# Patient Record
Sex: Male | Born: 2000 | Race: Black or African American | Hispanic: No | Marital: Single | Smoking: Never smoker
Health system: Southern US, Community
[De-identification: ages and names within clinical notes are randomized; demographics above are authoritative.]

## PROBLEM LIST (undated history)

## (undated) DIAGNOSIS — J45909 Unspecified asthma, uncomplicated: Secondary | ICD-10-CM

## (undated) DIAGNOSIS — F909 Attention-deficit hyperactivity disorder, unspecified type: Secondary | ICD-10-CM

## (undated) HISTORY — DX: Attention-deficit hyperactivity disorder, unspecified type: F90.9

## (undated) HISTORY — DX: Unspecified asthma, uncomplicated: J45.909

---
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
## 2019-04-09 ENCOUNTER — Other Ambulatory Visit: Payer: Self-pay

## 2019-04-09 DIAGNOSIS — Z20822 Contact with and (suspected) exposure to covid-19: Secondary | ICD-10-CM

## 2019-04-11 LAB — NOVEL CORONAVIRUS, NAA: SARS-CoV-2, NAA: DETECTED — AB

## 2021-08-25 ENCOUNTER — Emergency Department (HOSPITAL_COMMUNITY)
Admission: EM | Admit: 2021-08-25 | Discharge: 2021-08-25 | Disposition: A | Discharge status: Home / Self Care | Payer: Managed Care, Other (non HMO) | Attending: Emergency Medicine | Admitting: Emergency Medicine

## 2021-08-25 ENCOUNTER — Encounter (HOSPITAL_COMMUNITY): Payer: Self-pay | Admitting: Emergency Medicine

## 2021-08-25 ENCOUNTER — Other Ambulatory Visit: Payer: Self-pay

## 2021-08-25 DIAGNOSIS — R112 Nausea with vomiting, unspecified: Secondary | ICD-10-CM | POA: Diagnosis present

## 2021-08-25 DIAGNOSIS — R1011 Right upper quadrant pain: Secondary | ICD-10-CM | POA: Insufficient documentation

## 2021-08-25 DIAGNOSIS — Z20822 Contact with and (suspected) exposure to covid-19: Secondary | ICD-10-CM | POA: Insufficient documentation

## 2021-08-25 DIAGNOSIS — E860 Dehydration: Secondary | ICD-10-CM | POA: Diagnosis not present

## 2021-08-25 LAB — URINALYSIS, ROUTINE W REFLEX MICROSCOPIC
Bacteria, UA: NONE SEEN
Bilirubin Urine: NEGATIVE
Glucose, UA: NEGATIVE mg/dL
Hgb urine dipstick: NEGATIVE
Ketones, ur: 5 mg/dL — AB
Leukocytes,Ua: NEGATIVE
Nitrite: NEGATIVE
Protein, ur: 30 mg/dL — AB
Specific Gravity, Urine: 1.032 — ABNORMAL HIGH (ref 1.005–1.030)
pH: 7 (ref 5.0–8.0)

## 2021-08-25 LAB — COMPREHENSIVE METABOLIC PANEL
ALT: 24 U/L (ref 0–44)
AST: 23 U/L (ref 15–41)
Albumin: 4.3 g/dL (ref 3.5–5.0)
Alkaline Phosphatase: 77 U/L (ref 38–126)
Anion gap: 10 (ref 5–15)
BUN: 20 mg/dL (ref 6–20)
CO2: 25 mmol/L (ref 22–32)
Calcium: 9.5 mg/dL (ref 8.9–10.3)
Chloride: 104 mmol/L (ref 98–111)
Creatinine, Ser: 1.37 mg/dL — ABNORMAL HIGH (ref 0.61–1.24)
GFR, Estimated: >60 mL/min (ref >60)
Glucose, Bld: 118 mg/dL — ABNORMAL HIGH (ref 70–99)
Potassium: 4.5 mmol/L (ref 3.5–5.1)
Sodium: 139 mmol/L (ref 135–145)
Total Bilirubin: 1.5 mg/dL — ABNORMAL HIGH (ref 0.3–1.2)
Total Protein: 7.2 g/dL (ref 6.5–8.1)

## 2021-08-25 LAB — CBC
HCT: 47.8 % (ref 39.0–52.0)
Hemoglobin: 15.6 g/dL (ref 13.0–17.0)
MCH: 26.4 pg (ref 26.0–34.0)
MCHC: 32.6 g/dL (ref 30.0–36.0)
MCV: 80.9 fL (ref 80.0–100.0)
Platelets: 260 10*3/uL (ref 150–400)
RBC: 5.91 MIL/uL — ABNORMAL HIGH (ref 4.22–5.81)
RDW: 12.7 % (ref 11.5–15.5)
WBC: 7.5 10*3/uL (ref 4.0–10.5)
nRBC: 0 % (ref 0.0–0.2)

## 2021-08-25 LAB — RESP PANEL BY RT-PCR (FLU A&B, COVID) ARPGX2
Influenza A by PCR: NEGATIVE
Influenza B by PCR: NEGATIVE
SARS Coronavirus 2 by RT PCR: NEGATIVE

## 2021-08-25 LAB — LIPASE, BLOOD: Lipase: 30 U/L (ref 11–51)

## 2021-08-25 MED ORDER — SODIUM CHLORIDE 0.9 % IV BOLUS
1000.0000 mL | Freq: Once | INTRAVENOUS | Status: AC
  Administered 2021-08-25: 1000 mL via INTRAVENOUS

## 2021-08-25 MED ORDER — ONDANSETRON HCL 4 MG PO TABS: 4.0000 mg | ORAL_TABLET | ORAL | 0 refills | Status: DC | PRN

## 2021-08-25 MED ORDER — FAMOTIDINE IN NACL 20-0.9 MG/50ML-% IV SOLN
20.0000 mg | Freq: Once | INTRAVENOUS | Status: AC
  Administered 2021-08-25: 20 mg via INTRAVENOUS
  Filled 2021-08-25: qty 50

## 2021-08-25 MED ORDER — KETOROLAC TROMETHAMINE 15 MG/ML IJ SOLN
15.0000 mg | Freq: Once | INTRAMUSCULAR | Status: AC
  Administered 2021-08-25: 15 mg via INTRAVENOUS
  Filled 2021-08-25: qty 1

## 2021-08-25 MED ORDER — DICYCLOMINE HCL 10 MG PO CAPS
10.0000 mg | ORAL_CAPSULE | Freq: Once | ORAL | Status: AC
  Administered 2021-08-25: 10 mg via ORAL
  Filled 2021-08-25: qty 1

## 2021-08-25 MED ORDER — SUCRALFATE 1 G PO TABS: 1.0000 g | ORAL_TABLET | Freq: Three times a day (TID) | ORAL | 0 refills | Status: AC

## 2021-08-25 MED ORDER — LIDOCAINE VISCOUS HCL 2 % MT SOLN
15.0000 mL | Freq: Once | OROMUCOSAL | Status: DC
  Filled 2021-08-25: qty 15

## 2021-08-25 MED ORDER — ALUM & MAG HYDROXIDE-SIMETH 200-200-20 MG/5ML PO SUSP
30.0000 mL | Freq: Once | ORAL | Status: DC
  Filled 2021-08-25: qty 30

## 2021-08-25 MED ORDER — ONDANSETRON HCL 4 MG/2ML IJ SOLN
4.0000 mg | Freq: Once | INTRAMUSCULAR | Status: AC
  Administered 2021-08-25: 4 mg via INTRAVENOUS
  Filled 2021-08-25: qty 2

## 2021-08-25 NOTE — ED Triage Notes (Signed)
Pt. Stated, I started having stomach pain yesterday and then was throwing up and nauseated. I threw up 3 - 4 times and just nauseated.  ?

## 2021-08-25 NOTE — ED Provider Notes (Signed)
?[facility] ?Provider Note ? ? ?CSN: [id number] ?Arrival date & time: 08/25/21  [id number] ? ?  ? ?History ? ?Chief Complaint  ?Patient presents with  ? Abdominal Pain  ? Emesis  ? Nausea  ? ? ?Eda PaschalDominique Defibaugh is a 21 y.o. male. ? ?This is a 21 y.o. male with significant medical history as below, including ADHD, asthma, prior marijuana who presents to the ED with complaint of abdominal pain, nausea and vomiting. ? ?Location: Epigastric, right upper quadrant ?Duration: Around 7 hours ?Onset: Sudden ?Timing: Intermittent ?Description: Aching, gnawing pain. ?Severity: Mild ?Exacerbating/Alleviating Factors: None reported ?Associated Symptoms: Nausea and vomiting, 1 episode of emesis around 2 AM.  No diarrhea.  No fevers or chills, no rashes.  No recent travel or sick contacts.  Last p.o. intake was yesterday evening with [employer].  No one else ate the same food. ? ?Context: Abdominal pain, nausea and vomiting since early this morning.  No further episodes of emesis.  No medications prior to arrival.  Denies recent alcohol or THC use.  No illicit drug use.  No diarrhea.  No further emesis.  Pain improving.  Initially was epigastric, migrated to her upper quadrant, right flank.  No urinary changes.  No history of kidney stone.  No fevers chills. ? ? ? ?Past Medical History: ?No date: ADHD (attention deficit hyperactivity disorder) ?No date: Asthma ? ?No past surgical history on file.  ? ? ?The history is provided by the patient and the spouse. No language interpreter was used.  ?Abdominal Pain ?Associated symptoms: nausea and vomiting   ?Associated symptoms: no chest pain, no chills, no cough, no fever, no hematuria and no shortness of breath   ?Emesis ?Associated symptoms: abdominal pain   ?Associated symptoms: no chills, no cough, no fever and no headaches   ? ?  ? ?Home Medications ?Prior to Admission medications   ?Medication Sig Start Date End Date Taking? Authorizing Provider   ?lisdexamfetamine (VYVANSE) 40 MG capsule Take 1 capsule (40 mg total) by mouth daily. ?Patient not taking: Reported on 08/25/2021 08/20/13   Donita BrooksPickard, Warren T, MD  ?   ? ?Allergies    ?Patient has no known allergies.   ? ?Review of Systems   ?Review of Systems  ?Constitutional:  Negative for chills and fever.  ?HENT:  Negative for facial swelling and trouble swallowing.   ?Eyes:  Negative for photophobia and visual disturbance.  ?Respiratory:  Negative for cough and shortness of breath.   ?Cardiovascular:  Negative for chest pain and palpitations.  ?Gastrointestinal:  Positive for abdominal pain, nausea and vomiting.  ?Endocrine: Negative for polydipsia and polyuria.  ?Genitourinary:  Negative for difficulty urinating and hematuria.  ?Musculoskeletal:  Negative for gait problem and joint swelling.  ?Skin:  Negative for pallor and rash.  ?Neurological:  Negative for syncope and headaches.  ?Psychiatric/Behavioral:  Negative for agitation and confusion.   ? ?Physical Exam ?Updated Vital Signs ?BP (!) 99/56   Pulse 84   Temp 98.4 ?F (36.9 ?C) (Oral)   Resp 20   SpO2 100%  ?Physical Exam ?Vitals and nursing note reviewed.  ?Constitutional:   ?   General: He is not in acute distress. ?   Appearance: Normal appearance. He is well-developed. He is not toxic-appearing or diaphoretic.  ?HENT:  ?   Head: Normocephalic and atraumatic.  ?   Right Ear: External ear normal.  ?   Left Ear: External ear normal.  ?   Mouth/Throat:  ?  Mouth: Mucous membranes are moist.  ?Eyes:  ?   General: No scleral icterus. ?Cardiovascular:  ?   Rate and Rhythm: Normal rate and regular rhythm.  ?   Pulses: Normal pulses.  ?   Heart sounds: Normal heart sounds.  ?Pulmonary:  ?   Effort: Pulmonary effort is normal. No respiratory distress.  ?   Breath sounds: Normal breath sounds.  ?Abdominal:  ?   General: Abdomen is flat.  ?   Palpations: Abdomen is soft.  ?   Tenderness: There is no abdominal tenderness. There is no guarding or rebound.   ?Musculoskeletal:     ?   General: Normal range of motion.  ?   Cervical back: Normal range of motion.  ?   Right lower leg: No edema.  ?   Left lower leg: No edema.  ?Skin: ?   General: Skin is warm and dry.  ?   Capillary Refill: Capillary refill takes less than 2 seconds.  ?Neurological:  ?   Mental Status: He is alert and oriented to person, place, and time.  ?Psychiatric:     ?   Mood and Affect: Mood normal.     ?   Behavior: Behavior normal.  ? ? ?ED Results / Procedures / Treatments   ?Labs ?(all labs ordered are listed, but only abnormal results are displayed) ?Labs Reviewed  ?COMPREHENSIVE METABOLIC PANEL - Abnormal; Notable for the following components:  ?    Result Value  ? Glucose, Bld 118 (*)   ? Creatinine, Ser 1.37 (*)   ? Total Bilirubin 1.5 (*)   ? All other components within normal limits  ?CBC - Abnormal; Notable for the following components:  ? RBC 5.91 (*)   ? All other components within normal limits  ?URINALYSIS, ROUTINE W REFLEX MICROSCOPIC - Abnormal; Notable for the following components:  ? Specific Gravity, Urine 1.032 (*)   ? Ketones, ur 5 (*)   ? Protein, ur 30 (*)   ? All other components within normal limits  ?RESP PANEL BY RT-PCR (FLU A&B, COVID) ARPGX2  ?LIPASE, BLOOD  ? ? ?EKG ?EKG Interpretation ? ?Date/Time:  Wednesday August 25 2021 09:49:27 EDT ?Ventricular Rate:  81 ?PR Interval:  151 ?QRS Duration: 80 ?QT Interval:  340 ?QTC Calculation: 395 ?R Axis:   99 ?Text Interpretation: Sinus rhythm similar to prior 4/16 no stemi Confirmed by Tanda Rockers ([id number]) on 08/25/2021 9:59:54 AM ? ?Radiology ?No results found. ? ?Procedures ?Marland KitchenCritical Care ?Performed by: Sloan Leiter, DO ?Authorized by: Sloan Leiter, DO  ? ?Critical care provider statement:  ?  Critical care time (minutes):  30 ?  Critical care time was exclusive of:  Separately billable procedures and treating other patients ?  Critical care was necessary to treat or prevent imminent or life-threatening deterioration of the  following conditions:  Dehydration ?  Critical care was time spent personally by me on the following activities:  Development of treatment plan with patient or surrogate, discussions with consultants, evaluation of patient's response to treatment, examination of patient, ordering and review of laboratory studies, ordering and review of radiographic studies, ordering and performing treatments and interventions, pulse oximetry, re-evaluation of patient's condition, review of old charts and obtaining history from patient or surrogate  ? ? ?Medications Ordered in ED ?Medications  ?alum & mag hydroxide-simeth (MAALOX/MYLANTA) 200-200-20 MG/5ML suspension 30 mL (30 mLs Oral Not Given 08/25/21 0950)  ?  And  ?lidocaine (XYLOCAINE) 2 % viscous mouth solution 15 mL (  15 mLs Oral Not Given 08/25/21 0950)  ?ondansetron [facility]) injection 4 mg (4 mg Intravenous Given 08/25/21 0939)  ?famotidine (PEPCID) IVPB 20 mg premix (0 mg Intravenous Stopped 08/25/21 1026)  ?dicyclomine (BENTYL) capsule 10 mg (10 mg Oral Given 08/25/21 0946)  ?ketorolac (TORADOL) 15 MG/ML injection 15 mg (15 mg Intravenous Given 08/25/21 0940)  ?sodium chloride 0.9 % bolus 1,000 mL (1,000 mLs Intravenous New Bag/Given 08/25/21 1054)  ?sodium chloride 0.9 % bolus 1,000 mL (0 mLs Intravenous Stopped 08/25/21 1053)  ? ? ?ED Course/ Medical Decision Making/ A&P ?  ?                        ?Medical Decision Making ?Amount and/or Complexity of Data Reviewed ?Labs: ordered. ? ?Risk ?OTC drugs. ?Prescription drug management. ? ? ?Initial Impression and Ddx ?This patient presents to the Emergency Department for the above complaint. This involves an extensive number of treatment options and is a complaint that carries with it a high risk of complications and morbidity. Vital signs were reviewed.  ? ?Serious etiologies considered. Ddx includes but is not limited to: Enteritis, gastroenteritis, viral, foodborne illness, MSK, biliary colic ? ?Patient PMH that increases  complexity of ED encounter: N/A ? ?Social determinants of health include -N/A ? ?Previous records obtained and reviewed  ? ?Additional history obtained from spouse ? ? ?Interpretation of Diagnostics ?Labs & imaging re

## 2021-08-25 NOTE — Discharge Instructions (Addendum)
It was a pleasure caring for you today in the emergency department.  Please return to the emergency department for any worsening or worrisome symptoms.    There are many causes of abdominal pain. Most pain is not serious and goes away, but some pain gets worse, changes, or will not go away. Please return to the emergency department or see your doctor right away if you (or your family member) experience any of the following:  1. Pain that gets worse or moves to just one spot.  2. Pain that gets worse if you cough or sneeze.  3. Pain with going over a bump in the road.  4. Pain that does not get better in 24 hours.  5. Inability to keep down liquids (vomiting)-especially if you are making less urine.  6. Fainting.  7. Blood in the vomit or stool.  8. High fever or shaking chills.  9. Swelling of the abdomen.  10. Any new or worsening problem.      Follow-up Instructions  See your primary care provider if not completely better in the next 2-3 days. Come to the ED if you are unable to see them in this time frame.    Additional Instructions  No alcohol.  No caffeine, aspirin, or cigarettes.   Please return to the emergency department immediately for any new or concerning symptoms, or if you get worse.  

## 2021-08-25 NOTE — ED Notes (Signed)
Po fluids given

## 2021-08-25 NOTE — ED Notes (Signed)
No vomiting

## 2021-11-04 ENCOUNTER — Other Ambulatory Visit: Payer: Self-pay

## 2021-11-04 ENCOUNTER — Emergency Department (HOSPITAL_COMMUNITY): Payer: Managed Care, Other (non HMO)

## 2021-11-04 ENCOUNTER — Encounter (HOSPITAL_COMMUNITY): Payer: Self-pay

## 2021-11-04 ENCOUNTER — Emergency Department (HOSPITAL_COMMUNITY)
Admission: EM | Admit: 2021-11-04 | Discharge: 2021-11-04 | Disposition: A | Discharge status: Home / Self Care | Payer: Managed Care, Other (non HMO) | Attending: Emergency Medicine | Admitting: Emergency Medicine

## 2021-11-04 DIAGNOSIS — S93402A Sprain of unspecified ligament of left ankle, initial encounter: Secondary | ICD-10-CM | POA: Diagnosis not present

## 2021-11-04 DIAGNOSIS — Y9367 Activity, basketball: Secondary | ICD-10-CM | POA: Diagnosis not present

## 2021-11-04 DIAGNOSIS — S99912A Unspecified injury of left ankle, initial encounter: Secondary | ICD-10-CM | POA: Diagnosis present

## 2021-11-04 DIAGNOSIS — J45909 Unspecified asthma, uncomplicated: Secondary | ICD-10-CM | POA: Diagnosis not present

## 2021-11-04 DIAGNOSIS — X501XXA Overexertion from prolonged static or awkward postures, initial encounter: Secondary | ICD-10-CM | POA: Diagnosis not present

## 2021-11-04 MED ORDER — IBUPROFEN 400 MG PO TABS
600.0000 mg | ORAL_TABLET | Freq: Once | ORAL | Status: AC
  Administered 2021-11-04: 600 mg via ORAL
  Filled 2021-11-04: qty 1

## 2021-11-04 MED ORDER — IBUPROFEN 600 MG PO TABS: 600.0000 mg | ORAL_TABLET | Freq: Three times a day (TID) | ORAL | 0 refills | Status: DC | PRN

## 2021-11-04 NOTE — Discharge Instructions (Addendum)
Take the medications as needed for pain.  Use the crutches and splint for support.  You can start putting weight on your ankle as tolerated.  Your symptoms should improve over the week.  Follow-up with an orthopedic doctor for further relation of the symptoms do not resolve

## 2021-11-04 NOTE — ED Provider Notes (Signed)
[facility] EMERGENCY DEPARTMENT Provider Note   CSN: [id number] Arrival date & time: 11/04/21  1944     History  Chief Complaint  Patient presents with   Ankle Pain    Clinton Dougherty is a 21 y.o. male.   Ankle Pain  Patient has history of ADHD and asthma.  Patient presents to the ED with complaints of an ankle injury.  Patient states he was playing basketball and twisted his left ankle.  Since that time he has not been able to bear weight and is having pain in his left ankle.  He denies any other injuries  Home Medications Prior to Admission medications   Medication Sig Start Date End Date Taking? Authorizing Provider  ibuprofen (ADVIL) 600 MG tablet Take 1 tablet (600 mg total) by mouth every 8 (eight) hours as needed. 11/04/21  Yes Linwood Dibbles, MD  lisdexamfetamine (VYVANSE) 40 MG capsule Take 1 capsule (40 mg total) by mouth daily. Patient not taking: Reported on 08/25/2021 08/20/13   Donita Brooks, MD  ondansetron (ZOFRAN) 4 MG tablet Take 1 tablet (4 mg total) by mouth every 4 (four) hours as needed for nausea or vomiting. 08/25/21   Tanda Rockers A, DO  sucralfate (CARAFATE) 1 g tablet Take 1 tablet (1 g total) by mouth 4 (four) times daily -  with meals and at bedtime for 7 days. 08/25/21 09/01/21  Sloan Leiter, DO      Allergies    Patient has no known allergies.    Review of Systems   Review of Systems  Physical Exam Updated Vital Signs BP 135/70 (BP Location: Right Arm)   Pulse 70   Temp 98.3 F (36.8 C) (Oral)   Resp 18   SpO2 97%  Physical Exam Vitals and nursing note reviewed.  Constitutional:      General: He is not in acute distress.    Appearance: He is well-developed.  HENT:     Head: Normocephalic and atraumatic.     Right Ear: External ear normal.     Left Ear: External ear normal.  Eyes:     General: No scleral icterus.       Right eye: No discharge.        Left eye: No discharge.     Conjunctiva/sclera: Conjunctivae  normal.  Neck:     Trachea: No tracheal deviation.  Cardiovascular:     Rate and Rhythm: Normal rate.  Pulmonary:     Effort: Pulmonary effort is normal. No respiratory distress.     Breath sounds: No stridor.  Abdominal:     General: There is no distension.  Musculoskeletal:        General: Swelling and tenderness present. No deformity.     Cervical back: Neck supple.     Left ankle: Swelling present. Tenderness present over the lateral malleolus. No base of 5th metatarsal or proximal fibula tenderness.     Left Achilles Tendon: Normal.  Skin:    General: Skin is warm and dry.     Findings: No rash.  Neurological:     Mental Status: He is alert.     Cranial Nerves: Cranial nerve deficit: no gross deficits.    ED Results / Procedures / Treatments   Labs (all labs ordered are listed, but only abnormal results are displayed) Labs Reviewed - No data to display  EKG None  Radiology DG Ankle Complete Left  Result Date: 11/04/2021 CLINICAL DATA:  Basketball injury. EXAM: LEFT ANKLE COMPLETE -  3+ VIEW COMPARISON:  None Available. FINDINGS: There is no evidence of fracture, dislocation, or joint effusion. There is no evidence of arthropathy or other focal bone abnormality. There is soft tissue swelling over the lateral malleolus. IMPRESSION: 1. No acute fracture or dislocation. 2. Lateral soft tissue swelling. Electronically Signed   By: Darliss Cheney M.D.   On: 11/04/2021 20:58    Procedures Procedures    Medications Ordered in ED Medications  ibuprofen (ADVIL) tablet 600 mg (600 mg Oral Given 11/04/21 2136)    ED Course/ Medical Decision Making/ A&P                           Medical Decision Making Problems Addressed: Sprain of left ankle, unspecified ligament, initial encounter: acute illness or injury  Amount and/or Complexity of Data Reviewed Radiology: ordered and independent interpretation performed.    Details: No fracture or dislocation  Risk Prescription drug  management.   Evaluation and diagnostic testing in the emergency department does not suggest an emergent condition requiring admission or immediate intervention beyond what has been performed at this time.  The patient is safe for discharge and has been instructed to return immediately for worsening symptoms, change in symptoms or any other concerns.         Final Clinical Impression(s) / ED Diagnoses Final diagnoses:  Sprain of left ankle, unspecified ligament, initial encounter    Rx / DC Orders ED Discharge Orders          Ordered    ibuprofen (ADVIL) 600 MG tablet  Every 8 hours PRN        11/04/21 2142              Linwood Dibbles, MD 11/04/21 2143

## 2021-11-04 NOTE — ED Provider Triage Note (Signed)
Emergency Medicine Provider Triage Evaluation Note  Clinton Dougherty , a 21 y.o. male  was evaluated in triage.  Pt complains of left ankle pain after rolling it playing basketball.  Patient unable to range ankle in triage.  No overlying skin change.  DP pulse appreciated.  Review of Systems  Positive:  Negative:   Physical Exam  BP 135/70 (BP Location: Right Arm)   Pulse 70   Temp 98.3 F (36.8 C) (Oral)   Resp 18   SpO2 97%  Gen:   Awake, no distress   Resp:  Normal effort  MSK:   Moves extremities without difficulty  Other:    Medical Decision Making  Medically screening exam initiated at 8:20 PM.  Appropriate orders placed.  Regino Fournet was informed that the remainder of the evaluation will be completed by another provider, this initial triage assessment does not replace that evaluation, and the importance of remaining in the ED until their evaluation is complete.     Al Decant, PA-C 11/04/21 2021

## 2021-11-04 NOTE — ED Notes (Signed)
Portable xray at bedside.

## 2021-11-04 NOTE — ED Triage Notes (Signed)
Pt reports he was playing basketball and hour ago and twisted his ankle. Pt reports he is unable to bare weight.

## 2023-05-02 ENCOUNTER — Emergency Department
Admission: EM | Admit: 2023-05-02 | Discharge: 2023-05-02 | Disposition: A | Discharge status: Home / Self Care | Payer: 59 | Attending: Emergency Medicine | Admitting: Emergency Medicine

## 2023-05-02 ENCOUNTER — Emergency Department: Payer: 59

## 2023-05-02 ENCOUNTER — Other Ambulatory Visit: Payer: Self-pay

## 2023-05-02 DIAGNOSIS — J45909 Unspecified asthma, uncomplicated: Secondary | ICD-10-CM | POA: Insufficient documentation

## 2023-05-02 DIAGNOSIS — R079 Chest pain, unspecified: Secondary | ICD-10-CM | POA: Insufficient documentation

## 2023-05-02 LAB — BASIC METABOLIC PANEL
Anion gap: 8 (ref 5–15)
BUN: 16 mg/dL (ref 6–20)
CO2: 26 mmol/L (ref 22–32)
Calcium: 9.4 mg/dL (ref 8.9–10.3)
Chloride: 103 mmol/L (ref 98–111)
Creatinine, Ser: 1.2 mg/dL (ref 0.61–1.24)
GFR, Estimated: >60 mL/min (ref >60)
Glucose, Bld: 100 mg/dL — ABNORMAL HIGH (ref 70–99)
Potassium: 3.8 mmol/L (ref 3.5–5.1)
Sodium: 137 mmol/L (ref 135–145)

## 2023-05-02 LAB — CBC
HCT: 43.3 % (ref 39.0–52.0)
Hemoglobin: 14.1 g/dL (ref 13.0–17.0)
MCH: 25.9 pg — ABNORMAL LOW (ref 26.0–34.0)
MCHC: 32.6 g/dL (ref 30.0–36.0)
MCV: 79.4 fL — ABNORMAL LOW (ref 80.0–100.0)
Platelets: 248 10*3/uL (ref 150–400)
RBC: 5.45 MIL/uL (ref 4.22–5.81)
RDW: 13.1 % (ref 11.5–15.5)
WBC: 5.5 10*3/uL (ref 4.0–10.5)
nRBC: 0 % (ref 0.0–0.2)

## 2023-05-02 LAB — TROPONIN I (HIGH SENSITIVITY)
Troponin I (High Sensitivity): 3 ng/L (ref <18)
Troponin I (High Sensitivity): 3 ng/L (ref <18)

## 2023-05-02 NOTE — ED Triage Notes (Signed)
Pt to ED For chest pain started this am. Reports having panic attack this am.  Pt calm in triage, RR Even and unlabored, NAD noted

## 2023-05-02 NOTE — ED Notes (Addendum)
This RN reviewed paperwork with pt. No further complaints or questions. Pt ambulated to lobby.

## 2023-05-02 NOTE — ED Provider Notes (Signed)
[facility] Provider Note    Event Date/Time   First MD Initiated Contact with Patient 05/02/23 [phone]     (approximate)   History   Chief Complaint Chest Pain   HPI  Rylan Mine is a 22 y.o. male with past medical history of asthma and ADHD who presents to the ED complaining of chest pain.  Patient reports that he has had sharp pain in the left side of his chest since going to work this morning.  He describes any fevers or cough and has not had any difficulty breathing.  Pain does seem to come around to his left upper back, but is not exacerbated or alleviated by anything in particular.  He has not noticed any pain or swelling in his legs, denies any history of similar symptoms.     Physical Exam   Triage Vital Signs: ED Triage Vitals  Encounter Vitals Group     BP 05/02/23 0747 128/88     Systolic BP Percentile --      Diastolic BP Percentile --      Pulse Rate 05/02/23 0747 67     Resp 05/02/23 0747 20     Temp 05/02/23 0747 98.2 F (36.8 C)     Temp src --      SpO2 05/02/23 0747 97 %     Weight 05/02/23 0744 140 lb (63.5 kg)     Height 05/02/23 0744 5\' 6"  (1.676 m)     Head Circumference --      Peak Flow --      Pain Score 05/02/23 0744 6     Pain Loc --      Pain Education --      Exclude from Growth Chart --     Most recent vital signs: Vitals:   05/02/23 0900 05/02/23 1007  BP: 125/70 (!) 108/58  Pulse: 61 67  Resp: 16 10  Temp:    SpO2: 98% 100%    Constitutional: Alert and oriented. Eyes: Conjunctivae are normal. Head: Atraumatic. Nose: No congestion/rhinnorhea. Mouth/Throat: Mucous membranes are moist.  Cardiovascular: Normal rate, regular rhythm. Grossly normal heart sounds.  2+ radial pulses bilaterally. Respiratory: Normal respiratory effort.  No retractions. Lungs CTAB.  No chest wall tenderness to palpation. Gastrointestinal: Soft and nontender. No distention. Musculoskeletal: No lower extremity tenderness  nor edema.  Neurologic:  Normal speech and language. No gross focal neurologic deficits are appreciated.    ED Results / Procedures / Treatments   Labs (all labs ordered are listed, but only abnormal results are displayed) Labs Reviewed  BASIC METABOLIC PANEL - Abnormal; Notable for the following components:      Result Value   Glucose, Bld 100 (*)    All other components within normal limits  CBC - Abnormal; Notable for the following components:   MCV 79.4 (*)    MCH 25.9 (*)    All other components within normal limits  TROPONIN I (HIGH SENSITIVITY)  TROPONIN I (HIGH SENSITIVITY)     EKG  ED ECG REPORT I, Chesley Noon, the attending physician, personally viewed and interpreted this ECG.   Date: 05/02/2023  EKG Time: 7:45  Rate: 68  Rhythm: normal sinus rhythm  Axis: Normal  Intervals:none  ST&T Change: Anterolateral ST elevation consistent with benign early repolarization, similar to previous  RADIOLOGY Chest x-ray reviewed and interpreted by me with no infiltrate, edema, or effusion.  PROCEDURES:  Critical Care performed: No  Procedures   MEDICATIONS ORDERED IN ED:  Medications - No data to display   IMPRESSION / MDM / ASSESSMENT AND PLAN / ED COURSE  I reviewed the triage vital signs and the nursing notes.                              22 y.o. male with no significant past medical history who presents to the ED complaining of sharp left-sided chest pain since going to work this morning.  Patient's presentation is most consistent with acute presentation with potential threat to life or bodily function.  Differential diagnosis includes, but is not limited to, ACS, PE, pneumonia, pneumothorax, musculoskeletal pain, GERD, anxiety.  Patient nontoxic-appearing and in no acute distress, vital signs are unremarkable.  EKG shows sinus rhythm with ST elevation anteriorly and laterally, overall similar to previous and appears most consistent with benign early  repolarization.  EKG was reviewed with Dr. Okey Dupre of cardiology, who agrees that this likely represents benign early repolarization.  Initial troponin within normal limits, we will check second set troponin given acute onset.  Additional labs are reassuring with no significant anemia, leukocytosis, electrolyte abnormality, or AKI.  Chest x-ray is unremarkable and I doubt PE given he is PERC negative.  Repeat troponin within normal limits, patient with minimal pain on reassessment and is appropriate for discharge home with outpatient follow-up.  He was counseled to return to the ED for new or worsening symptoms.  Patient agrees with plan.      FINAL CLINICAL IMPRESSION(S) / ED DIAGNOSES   Final diagnoses:  Nonspecific chest pain     Rx / DC Orders   ED Discharge Orders     None        Note:  This document was prepared using Dragon voice recognition software and may include unintentional dictation errors.   Chesley Noon, MD 05/02/23 1058

## 2023-05-02 NOTE — ED Notes (Signed)
Per Dr Larinda Buttery, no code STEMI at this time, pt taken to room

## 2023-12-18 ENCOUNTER — Emergency Department
Admission: EM | Admit: 2023-12-18 | Discharge: 2023-12-18 | Disposition: A | Discharge status: Home / Self Care | Payer: Self-pay | Attending: Emergency Medicine | Admitting: Emergency Medicine

## 2023-12-18 ENCOUNTER — Emergency Department: Payer: Self-pay

## 2023-12-18 ENCOUNTER — Encounter: Payer: Self-pay | Admitting: Intensive Care

## 2023-12-18 ENCOUNTER — Other Ambulatory Visit: Payer: Self-pay

## 2023-12-18 DIAGNOSIS — R002 Palpitations: Secondary | ICD-10-CM | POA: Insufficient documentation

## 2023-12-18 LAB — BASIC METABOLIC PANEL WITH GFR
Anion gap: 8 (ref 5–15)
BUN: 19 mg/dL (ref 6–20)
CO2: 23 mmol/L (ref 22–32)
Calcium: 9.2 mg/dL (ref 8.9–10.3)
Chloride: 107 mmol/L (ref 98–111)
Creatinine, Ser: 1.12 mg/dL (ref 0.61–1.24)
GFR, Estimated: >60 mL/min (ref >60)
Glucose, Bld: 97 mg/dL (ref 70–99)
Potassium: 3.8 mmol/L (ref 3.5–5.1)
Sodium: 138 mmol/L (ref 135–145)

## 2023-12-18 LAB — CBC
HCT: 39.8 % (ref 39.0–52.0)
Hemoglobin: 12.9 g/dL — ABNORMAL LOW (ref 13.0–17.0)
MCH: 25.4 pg — ABNORMAL LOW (ref 26.0–34.0)
MCHC: 32.4 g/dL (ref 30.0–36.0)
MCV: 78.3 fL — ABNORMAL LOW (ref 80.0–100.0)
Platelets: 259 K/uL (ref 150–400)
RBC: 5.08 MIL/uL (ref 4.22–5.81)
RDW: 12.6 % (ref 11.5–15.5)
WBC: 3.7 K/uL — ABNORMAL LOW (ref 4.0–10.5)
nRBC: 0 % (ref 0.0–0.2)

## 2023-12-18 LAB — TROPONIN I (HIGH SENSITIVITY): Troponin I (High Sensitivity): <2 ng/L (ref <18)

## 2023-12-18 NOTE — ED Provider Notes (Signed)
 [facility] Provider Note    Event Date/Time   First MD Initiated Contact with Patient 12/18/23 1301     (approximate)   History   Palpitations   HPI  Clinton Dougherty is a 23 y.o. male with no significant past medical history who presents after feeling his heart skip a beat while at work today.  Patient is an Journalist, newspaper.  He reports he occasionally feels his heart skip a beat at home, today it seemed to happen multiple times in a row and he became concerned.  He denies chest pain to me.  He denies heart racing.  No shortness of breath.  He feels quite well at this time.     Physical Exam   Triage Vital Signs: ED Triage Vitals  Encounter Vitals Group     BP 12/18/23 1217 121/76     Girls Systolic BP Percentile --      Girls Diastolic BP Percentile --      Boys Systolic BP Percentile --      Boys Diastolic BP Percentile --      Pulse Rate 12/18/23 1217 69     Resp 12/18/23 1217 16     Temp 12/18/23 1217 98.4 F (36.9 C)     Temp Source 12/18/23 1217 Oral     SpO2 12/18/23 1217 100 %     Weight 12/18/23 1218 61.2 kg (135 lb)     Height 12/18/23 1218 1.676 m (5' 6)     Head Circumference --      Peak Flow --      Pain Score 12/18/23 1217 0     Pain Loc --      Pain Education --      Exclude from Growth Chart --     Most recent vital signs: Vitals:   12/18/23 1217  BP: 121/76  Pulse: 69  Resp: 16  Temp: 98.4 F (36.9 C)  SpO2: 100%     General: Awake, no distress.  CV:  Good peripheral perfusion.  Regular rate and rhythm, no skip beats noted Resp:  Normal effort.  Clear to auscultation bilaterally Abd:  No distention.  Other:     ED Results / Procedures / Treatments   Labs (all labs ordered are listed, but only abnormal results are displayed) Labs Reviewed  CBC - Abnormal; Notable for the following components:      Result Value   WBC 3.7 (*)    Hemoglobin 12.9 (*)    MCV 78.3 (*)    MCH 25.4 (*)    All other  components within normal limits  BASIC METABOLIC PANEL WITH GFR  TROPONIN I (HIGH SENSITIVITY)  TROPONIN I (HIGH SENSITIVITY)     EKG  ED ECG REPORT I, Lamar Price, the attending physician, personally viewed and interpreted this ECG.  Date: 12/18/2023  Rhythm: normal sinus rhythm QRS Axis: normal Intervals: normal ST/T Wave abnormalities: Nonspecific changes Narrative Interpretation: No PVCs    RADIOLOGY Chest x-ray viewed interpret by me, no acute abnormality    PROCEDURES:  Critical Care performed:   Procedures   MEDICATIONS ORDERED IN ED: Medications - No data to display   IMPRESSION / MDM / ASSESSMENT AND PLAN / ED COURSE  I reviewed the triage vital signs and the nursing notes. Patient's presentation is most consistent with acute presentation with potential threat to life or bodily function.  Patient presents with palpitations, differential includes PVC, other arrhythmia.  His heart rate is normal here,  no evidence of PVCs on exam or EKG.  Lab work is quite reassuring, normal troponin, chest x-ray unremarkable.  Given that patient remains asymptomatic and has had these symptoms intermittently for quite some time, appropriate for discharge with outpatient follow-up with cardiology for possible monitoring        FINAL CLINICAL IMPRESSION(S) / ED DIAGNOSES   Final diagnoses:  Palpitation     Rx / DC Orders   ED Discharge Orders          Ordered    Ambulatory referral to Cardiology       Comments: If you have not heard from the Cardiology office within the next 72 hours please call [phone].   12/18/23 1340             Note:  This document was prepared using Dragon voice recognition software and may include unintentional dictation errors.   Arlander Charleston, MD 12/18/23 1530

## 2023-12-18 NOTE — ED Notes (Signed)
 See triage note  Presents with pressure to back of head /neck  States this started 3-4 days ago  But states today while at work he felt like his heart was skipping a beat   Thought it may be a panic attack

## 2023-12-18 NOTE — ED Triage Notes (Signed)
 Arrived by [facility] from work with c/o anxiety and chest discomfort. Described his chest as feeling like it is skipping a beat. Like I just ran four miles  EMS vitals: 150/70 b/p 80sHR  EMS administered: 500cc LR

## 2024-01-05 ENCOUNTER — Other Ambulatory Visit: Payer: Self-pay

## 2024-01-05 ENCOUNTER — Emergency Department
Admission: EM | Admit: 2024-01-05 | Discharge: 2024-01-05 | Disposition: A | Discharge status: Home / Self Care | Payer: Self-pay | Attending: Emergency Medicine | Admitting: Emergency Medicine

## 2024-01-05 DIAGNOSIS — T675XXA Heat exhaustion, unspecified, initial encounter: Secondary | ICD-10-CM | POA: Insufficient documentation

## 2024-01-05 DIAGNOSIS — E860 Dehydration: Secondary | ICD-10-CM | POA: Insufficient documentation

## 2024-01-05 LAB — COMPREHENSIVE METABOLIC PANEL WITH GFR
ALT: 25 U/L (ref 0–44)
AST: 20 U/L (ref 15–41)
Albumin: 4.3 g/dL (ref 3.5–5.0)
Alkaline Phosphatase: 48 U/L (ref 38–126)
Anion gap: 11 (ref 5–15)
BUN: 17 mg/dL (ref 6–20)
CO2: 25 mmol/L (ref 22–32)
Calcium: 9.5 mg/dL (ref 8.9–10.3)
Chloride: 102 mmol/L (ref 98–111)
Creatinine, Ser: 1.07 mg/dL (ref 0.61–1.24)
GFR, Estimated: >60 mL/min (ref >60)
Glucose, Bld: 116 mg/dL — ABNORMAL HIGH (ref 70–99)
Potassium: 3.6 mmol/L (ref 3.5–5.1)
Sodium: 138 mmol/L (ref 135–145)
Total Bilirubin: 1.3 mg/dL — ABNORMAL HIGH (ref 0.0–1.2)
Total Protein: 7.5 g/dL (ref 6.5–8.1)

## 2024-01-05 LAB — CBC
HCT: 43.7 % (ref 39.0–52.0)
Hemoglobin: 14.3 g/dL (ref 13.0–17.0)
MCH: 26 pg (ref 26.0–34.0)
MCHC: 32.7 g/dL (ref 30.0–36.0)
MCV: 79.5 fL — ABNORMAL LOW (ref 80.0–100.0)
Platelets: 273 K/uL (ref 150–400)
RBC: 5.5 MIL/uL (ref 4.22–5.81)
RDW: 12.8 % (ref 11.5–15.5)
WBC: 4.4 K/uL (ref 4.0–10.5)
nRBC: 0 % (ref 0.0–0.2)

## 2024-01-05 LAB — CK: Total CK: 332 U/L (ref 49–397)

## 2024-01-05 MED ORDER — SODIUM CHLORIDE 0.9 % IV BOLUS
1000.0000 mL | Freq: Once | INTRAVENOUS | Status: AC
  Administered 2024-01-05: 1000 mL via INTRAVENOUS

## 2024-01-05 NOTE — Discharge Instructions (Signed)
 With your regular doctor if not better in 3 days Return the emergency department worsening

## 2024-01-05 NOTE — ED Provider Notes (Signed)
 [facility] Provider Note    Event Date/Time   First MD Initiated Contact with Patient 01/05/24 1209     (approximate)   History   Numbness   HPI  Torez Beauregard is a 23 y.o. male with no significant past medical history presents emergency department complaining of numbness tingling, weakness in the legs.  Patient states that he works out in the heat.  Had been doing a lot of sweating then to have diarrhea yesterday and today.  States this is what happened last time he was dehydrated.  Does not feel like it got better with drinking some Gatorade.  Denies fever or chills.  As far as the panic attacks he does not feel like he is having a panic attack right this minute.      Physical Exam   Triage Vital Signs: ED Triage Vitals  Encounter Vitals Group     BP 01/05/24 1141 127/88     Girls Systolic BP Percentile --      Girls Diastolic BP Percentile --      Boys Systolic BP Percentile --      Boys Diastolic BP Percentile --      Pulse Rate 01/05/24 1141 68     Resp 01/05/24 1141 16     Temp 01/05/24 1141 98.1 F (36.7 C)     Temp Source 01/05/24 1141 Oral     SpO2 01/05/24 1141 100 %     Weight 01/05/24 1143 140 lb (63.5 kg)     Height 01/05/24 1143 5' 6 (1.676 m)     Head Circumference --      Peak Flow --      Pain Score 01/05/24 1142 0     Pain Loc --      Pain Education --      Exclude from Growth Chart --     Most recent vital signs: Vitals:   01/05/24 1141  BP: 127/88  Pulse: 68  Resp: 16  Temp: 98.1 F (36.7 C)  SpO2: 100%     General: Awake, no distress.   CV:  Good peripheral perfusion. regular rate and  rhythm Resp:  Normal effort. Lungs cta Abd:  No distention.  Nontender Other:      ED Results / Procedures / Treatments   Labs (all labs ordered are listed, but only abnormal results are displayed) Labs Reviewed  CBC - Abnormal; Notable for the following components:      Result Value   MCV 79.5 (*)    All other  components within normal limits  COMPREHENSIVE METABOLIC PANEL WITH GFR - Abnormal; Notable for the following components:   Glucose, Bld 116 (*)    Total Bilirubin 1.3 (*)    All other components within normal limits  CK     EKG     RADIOLOGY     PROCEDURES:   Procedures  Critical Care:  no Chief Complaint  Patient presents with   Numbness      MEDICATIONS ORDERED IN ED: Medications  sodium chloride  0.9 % bolus 1,000 mL (0 mLs Intravenous Stopped 01/05/24 1350)     IMPRESSION / MDM / ASSESSMENT AND PLAN / ED COURSE  I reviewed the triage vital signs and the nursing notes.                              Differential diagnosis includes, but is not limited to, heat exhaustion, heatstroke,  rhabdomyolysis, dehydration, gastroenteritis, viral gastroenteritis, stroke  Patient's presentation is most consistent with acute illness / injury with system symptoms.   Medications given: Normal saline 1 L IV  I feel stroke is less likely, the patient does not have a history of hypertension, he is 23 years old and he appears to be well.  Similar symptoms in the past with dehydration.  Also when exposed to the heat have concerned that he could be having heat exhaustion.  Will go ahead and order labs, fluids.   Labs reassuring, therefore rhabdomyolysis, heatstroke, gastroenteritis less likely  Patient is feeling much better after liter of fluids.  Feel it is more of a heat exhaustion and dehydration.  He can follow-up with his regular doctor if not improving in 2 days.  Return emergency department if worsening.  He is in agreement with this treatment plan.  Discharged stable condition.   FINAL CLINICAL IMPRESSION(S) / ED DIAGNOSES   Final diagnoses:  Heat exhaustion, initial encounter  Dehydration     Rx / DC Orders   ED Discharge Orders     None        Note:  This document was prepared using Dragon voice recognition software and may include unintentional dictation  errors.    Gasper Devere ORN, PA-C 01/05/24 1851    Willo Dunnings, MD 01/05/24 [phone]

## 2024-01-05 NOTE — ED Triage Notes (Signed)
 Pt to ED via POV. Pt states when he was on the way to work, he felt numbness and tingling on bilateral arms and legs. Pt states hx of panic attacks for the past 2-3 months. Pt states no symptoms at this time. Pt ambulatory to triage, NAD.

## 2024-01-11 ENCOUNTER — Ambulatory Visit: Payer: Self-pay | Attending: Cardiology | Admitting: Cardiology
